# Patient Record
Sex: Male | Born: 1990 | Race: Black or African American | Hispanic: No | Marital: Single
Health system: Midwestern US, Community
[De-identification: ages and names within clinical notes are randomized; demographics above are authoritative.]

---

## 2013-03-08 ENCOUNTER — Encounter (HOSPITAL_COMMUNITY): Payer: Self-pay | Admitting: Emergency Medicine

## 2013-03-08 ENCOUNTER — Emergency Department (HOSPITAL_COMMUNITY): Payer: Self-pay

## 2013-03-08 ENCOUNTER — Emergency Department (HOSPITAL_COMMUNITY)
Admission: EM | Admit: 2013-03-08 | Discharge: 2013-03-08 | Disposition: A | Discharge status: Home / Self Care | Payer: Self-pay | Attending: Emergency Medicine | Admitting: Emergency Medicine

## 2013-03-08 DIAGNOSIS — R404 Transient alteration of awareness: Secondary | ICD-10-CM | POA: Insufficient documentation

## 2013-03-08 DIAGNOSIS — F411 Generalized anxiety disorder: Secondary | ICD-10-CM | POA: Insufficient documentation

## 2013-03-08 DIAGNOSIS — F17200 Nicotine dependence, unspecified, uncomplicated: Secondary | ICD-10-CM | POA: Insufficient documentation

## 2013-03-08 DIAGNOSIS — S0003XA Contusion of scalp, initial encounter: Secondary | ICD-10-CM | POA: Insufficient documentation

## 2013-03-08 DIAGNOSIS — S63509A Unspecified sprain of unspecified wrist, initial encounter: Secondary | ICD-10-CM | POA: Insufficient documentation

## 2013-03-08 DIAGNOSIS — S1093XA Contusion of unspecified part of neck, initial encounter: Secondary | ICD-10-CM | POA: Insufficient documentation

## 2013-03-08 DIAGNOSIS — S63501A Unspecified sprain of right wrist, initial encounter: Secondary | ICD-10-CM

## 2013-03-08 DIAGNOSIS — S0990XA Unspecified injury of head, initial encounter: Secondary | ICD-10-CM

## 2013-03-08 MED ORDER — OXYCODONE-ACETAMINOPHEN 5-325 MG PO TABS
1.0000 | ORAL_TABLET | Freq: Once | ORAL | Status: DC
  Filled 2013-03-08: qty 2

## 2013-03-08 MED ORDER — OXYCODONE-ACETAMINOPHEN 5-325 MG PO TABS
2.0000 | ORAL_TABLET | Freq: Once | ORAL | Status: AC
  Administered 2013-03-08: 2 via ORAL

## 2013-03-08 MED ORDER — NAPROXEN 500 MG PO TABS: 500.0000 mg | ORAL_TABLET | Freq: Two times a day (BID) | ORAL | Status: DC

## 2013-03-08 MED ORDER — OXYCODONE-ACETAMINOPHEN 5-325 MG PO TABS: 2.0000 | ORAL_TABLET | Freq: Four times a day (QID) | ORAL | Status: DC | PRN
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
## 2013-03-08 NOTE — ED Notes (Signed)
Pt back from x-ray.

## 2013-03-08 NOTE — ED Notes (Signed)
Patient transported to X-ray 
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
## 2013-08-11 ENCOUNTER — Encounter (HOSPITAL_COMMUNITY): Payer: Self-pay | Admitting: Emergency Medicine

## 2013-08-11 ENCOUNTER — Emergency Department (INDEPENDENT_AMBULATORY_CARE_PROVIDER_SITE_OTHER)
Admission: EM | Admit: 2013-08-11 | Discharge: 2013-08-11 | Disposition: A | Discharge status: Home / Self Care | Payer: BC Managed Care – PPO | Source: Home / Self Care | Attending: Family Medicine | Admitting: Family Medicine

## 2013-08-11 DIAGNOSIS — L989 Disorder of the skin and subcutaneous tissue, unspecified: Secondary | ICD-10-CM

## 2013-08-11 NOTE — ED Notes (Signed)
Pt  Has  Small  Swollen    Bump  on  Back of  Neck        Symptoms  x3  Days         Pt  Known causative   Agent           Pt  Is  In no   Acute  Distress       Sitting  Upright on  Exam table  In no acute  Distress

## 2013-08-11 NOTE — ED Provider Notes (Signed)
CSN: [id number]     Arrival date & time 08/11/13  1235 History   First MD Initiated Contact with Patient 08/11/13 1313     Chief Complaint  Patient presents with  . Mass   (Consider location/radiation/quality/duration/timing/severity/associated sxs/prior Treatment) HPI Comments: 54m presents c/o a possible mass or spider bite on the back of his neck. This started a few days ago. It was getting larger until 2 days ago started to get smaller. It is much smaller today than yesterday. Area is very slightly tender to touch. No fever, chills, or discharge from the lesion. His girlfriend says that some pus came out of it yesterday. His arms have also been feeling strange, he wonders if that could be affecting it but he believes he just slept wrong.   History reviewed. No pertinent past medical history. History reviewed. No pertinent past surgical history. History reviewed. No pertinent family history. History  Substance Use Topics  . Smoking status: Current Every Day Smoker  . Smokeless tobacco: Not on file  . Alcohol Use: Yes    Review of Systems  Constitutional: Negative for fever, chills and fatigue.  HENT: Negative for sore throat.   Eyes: Negative for visual disturbance.  Respiratory: Negative for cough and shortness of breath.   Cardiovascular: Negative for chest pain, palpitations and leg swelling.  Gastrointestinal: Negative for nausea, vomiting, abdominal pain, diarrhea and constipation.  Genitourinary: Negative for dysuria, urgency, frequency and hematuria.  Musculoskeletal: Negative for arthralgias, myalgias, neck pain and neck stiffness.  Skin:       See HPI  Neurological: Negative for dizziness, weakness and light-headedness.    Allergies  Sulfa antibiotics  Home Medications   Current Outpatient Rx  Name  Route  Sig  Dispense  Refill  . naproxen (NAPROSYN) 500 MG tablet   Oral   Take 1 tablet (500 mg total) by mouth 2 (two) times daily.   30 tablet   0   .  oxyCODONE-acetaminophen (PERCOCET/ROXICET) 5-325 MG per tablet   Oral   Take 2 tablets by mouth every 6 (six) hours as needed for pain.   20 tablet   0    BP 128/79  Pulse 68  Temp(Src) 98.7 F (37.1 C) (Oral)  Resp 14  SpO2 100% Physical Exam  Nursing note and vitals reviewed. Constitutional: He is oriented to person, place, and time. He appears well-developed and well-nourished. No distress.  HENT:  Head: Normocephalic and atraumatic.  Neck:    Pulmonary/Chest: Effort normal. No respiratory distress.  Neurological: He is alert and oriented to person, place, and time. Coordination normal.  Skin: Skin is warm and dry. No rash noted. He is not diaphoretic.  Psychiatric: He has a normal mood and affect. Judgment normal.    ED Course  Procedures (including critical care time) Labs Review Labs Reviewed - No data to display Imaging Review No results found.    MDM   1. Skin lesion    This is a pimple.  Self-limiting, f/u PRN     Graylon Good, PA-C 08/11/13 1328

## 2013-08-12 NOTE — ED Provider Notes (Signed)
Medical screening examination/treatment/procedure(s) were performed by a resident physician or non-physician practitioner and as the supervising physician I was immediately available for consultation/collaboration.  Keion Neels, MD    Diamonte Stavely S Shrihaan Porzio, MD 08/12/13 0737 

## 2013-08-23 ENCOUNTER — Encounter (HOSPITAL_COMMUNITY): Payer: Self-pay | Admitting: Emergency Medicine

## 2013-08-23 ENCOUNTER — Emergency Department (HOSPITAL_COMMUNITY)
Admission: EM | Admit: 2013-08-23 | Discharge: 2013-08-23 | Disposition: A | Discharge status: Home / Self Care | Payer: BC Managed Care – PPO | Attending: Emergency Medicine | Admitting: Emergency Medicine

## 2013-08-23 DIAGNOSIS — R5383 Other fatigue: Secondary | ICD-10-CM | POA: Insufficient documentation

## 2013-08-23 DIAGNOSIS — R5381 Other malaise: Secondary | ICD-10-CM | POA: Insufficient documentation

## 2013-08-23 DIAGNOSIS — K529 Noninfective gastroenteritis and colitis, unspecified: Secondary | ICD-10-CM

## 2013-08-23 DIAGNOSIS — Z791 Long term (current) use of non-steroidal anti-inflammatories (NSAID): Secondary | ICD-10-CM | POA: Insufficient documentation

## 2013-08-23 DIAGNOSIS — K5289 Other specified noninfective gastroenteritis and colitis: Secondary | ICD-10-CM | POA: Insufficient documentation

## 2013-08-23 DIAGNOSIS — F17200 Nicotine dependence, unspecified, uncomplicated: Secondary | ICD-10-CM | POA: Insufficient documentation

## 2013-08-23 LAB — CBC WITH DIFFERENTIAL/PLATELET
Eosinophils Absolute: 0.3 10*3/uL (ref 0.0–0.7)
Eosinophils Relative: 4 % (ref 0–5)
HCT: 41.6 % (ref 39.0–52.0)
Lymphs Abs: 2.5 10*3/uL (ref 0.7–4.0)
MCH: 30.6 pg (ref 26.0–34.0)
MCV: 86.7 fL (ref 78.0–100.0)
Monocytes Absolute: 0.6 10*3/uL (ref 0.1–1.0)
Platelets: 205 10*3/uL (ref 150–400)
RBC: 4.8 MIL/uL (ref 4.22–5.81)

## 2013-08-23 LAB — COMPREHENSIVE METABOLIC PANEL
ALT: 20 U/L (ref 0–53)
BUN: 12 mg/dL (ref 6–23)
CO2: 24 mEq/L (ref 19–32)
Calcium: 9.1 mg/dL (ref 8.4–10.5)
Creatinine, Ser: 0.96 mg/dL (ref 0.50–1.35)
GFR calc Af Amer: >90 mL/min (ref >90)
GFR calc non Af Amer: >90 mL/min (ref >90)
Glucose, Bld: 114 mg/dL — ABNORMAL HIGH (ref 70–99)
Sodium: 141 mEq/L (ref 135–145)
Total Protein: 6.9 g/dL (ref 6.0–8.3)

## 2013-08-23 LAB — URINALYSIS, ROUTINE W REFLEX MICROSCOPIC
Bilirubin Urine: NEGATIVE
Glucose, UA: NEGATIVE mg/dL
Hgb urine dipstick: NEGATIVE
Protein, ur: NEGATIVE mg/dL
Specific Gravity, Urine: 1.034 — ABNORMAL HIGH (ref 1.005–1.030)
Urobilinogen, UA: 1 mg/dL (ref 0.0–1.0)

## 2013-08-23 LAB — LIPASE, BLOOD: Lipase: 40 U/L (ref 11–59)

## 2013-08-23 MED ORDER — PROMETHAZINE HCL 25 MG PO TABS: 25.0000 mg | ORAL_TABLET | Freq: Four times a day (QID) | ORAL | Status: DC | PRN

## 2013-08-23 MED ORDER — ONDANSETRON HCL 4 MG/2ML IJ SOLN
4.0000 mg | Freq: Once | INTRAMUSCULAR | Status: AC
  Administered 2013-08-23: 4 mg via INTRAVENOUS
  Filled 2013-08-23: qty 2

## 2013-08-23 MED ORDER — KETOROLAC TROMETHAMINE 30 MG/ML IJ SOLN
30.0000 mg | Freq: Once | INTRAMUSCULAR | Status: AC
  Administered 2013-08-23: 30 mg via INTRAVENOUS
  Filled 2013-08-23: qty 1

## 2013-08-23 MED ORDER — SODIUM CHLORIDE 0.9 % IV SOLN
Freq: Once | INTRAVENOUS | Status: AC
  Administered 2013-08-23: 05:00:00 via INTRAVENOUS

## 2013-08-23 NOTE — ED Provider Notes (Signed)
CSN: [id number]     Arrival date & time 08/23/13  0416 History   First MD Initiated Contact with Patient 08/23/13 0424     Chief Complaint  Patient presents with  . Abdominal Pain  . Fatigue  . Nausea   (Consider location/radiation/quality/duration/timing/severity/associated sxs/prior Treatment) HPI Comments: Patient is an otherwise healthy 22 year old male presents to the emergency department with generalized abdominal cramping, nausea, vomiting, and diarrhea for the past 6 hours. He states that he ate a restaurant just before his symptoms began. Everything is nonbloody. He denies fevers. He has had no prior abdominal surgery.  Patient is a 22 y.o. male presenting with abdominal pain. The history is provided by the patient.  Abdominal Pain Pain location:  Generalized Pain quality: cramping   Pain radiates to:  Does not radiate Pain severity:  Moderate Onset quality:  Sudden Duration:  6 hours Timing:  Constant Progression:  Unchanged Chronicity:  New Context: eating   Relieved by:  Nothing Worsened by:  Nothing tried   History reviewed. No pertinent past medical history. History reviewed. No pertinent past surgical history. History reviewed. No pertinent family history. History  Substance Use Topics  . Smoking status: Current Every Day Smoker  . Smokeless tobacco: Not on file  . Alcohol Use: Yes    Review of Systems  Gastrointestinal: Positive for abdominal pain.  All other systems reviewed and are negative.    Allergies  Sulfa antibiotics  Home Medications   Current Outpatient Rx  Name  Route  Sig  Dispense  Refill  . naproxen (NAPROSYN) 500 MG tablet   Oral   Take 1 tablet (500 mg total) by mouth 2 (two) times daily.   30 tablet   0   . oxyCODONE-acetaminophen (PERCOCET/ROXICET) 5-325 MG per tablet   Oral   Take 2 tablets by mouth every 6 (six) hours as needed for pain.   20 tablet   0    BP 137/77  Pulse 92  Temp(Src) 98.3 F (36.8 C) (Oral)   Resp 18  Ht 5\' 11"  (1.803 m)  Wt 225 lb (102.059 kg)  BMI 31.39 kg/m2  SpO2 100% Physical Exam  Nursing note and vitals reviewed. Constitutional: He is oriented to person, place, and time. He appears well-developed and well-nourished. No distress.  HENT:  Head: Normocephalic and atraumatic.  Mouth/Throat: Oropharynx is clear and moist.  Neck: Normal range of motion. Neck supple.  Cardiovascular: Normal rate, regular rhythm and normal heart sounds.   No murmur heard. Pulmonary/Chest: Effort normal and breath sounds normal. No respiratory distress. He has no wheezes.  Abdominal: Soft. Bowel sounds are normal. He exhibits no distension. There is tenderness.  There is mild tenderness to palpation in all 4 quadrants with no rebound and no guarding.  Musculoskeletal: Normal range of motion. He exhibits no edema.  Neurological: He is alert and oriented to person, place, and time.  Skin: Skin is warm and dry. He is not diaphoretic.    ED Course  Procedures (including critical care time) Labs Review Labs Reviewed  URINALYSIS, ROUTINE W REFLEX MICROSCOPIC  CBC WITH DIFFERENTIAL  COMPREHENSIVE METABOLIC PANEL  LIPASE, BLOOD   Imaging Review No results found.    MDM  No diagnosis found. Patient presents here with sudden onset abdominal pain, nausea vomiting and diarrhea. Workup reveals normal laboratory studies. His exam was unremarkable. He was given fluids, Zofran, Toradol and is now feeling better. Physically he is stable for discharge to home. His presentation, exam, and workup was  all consistent with either a food borne illness or viral gastroenteritis.    Geoffery Lyons, MD 08/23/13 [phone]
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
## 2015-03-24 ENCOUNTER — Inpatient Hospital Stay
Admit: 2015-03-24 | Discharge: 2015-03-24 | Disposition: A | Discharge status: Home / Self Care | Payer: BLUE CROSS/BLUE SHIELD | Attending: Emergency Medicine

## 2015-03-24 DIAGNOSIS — S51011A Laceration without foreign body of right elbow, initial encounter: Secondary | ICD-10-CM

## 2015-03-24 MED ORDER — DIPHTH,PERTUS(AC)TETANUS VAC(PF) 2.5 LF UNIT-8 MCG-5 LF/0.5 ML INJ
INTRAMUSCULAR | Status: AC
Start: 2015-03-24 — End: 2015-03-24
  Administered 2015-03-24: 14:00:00 via INTRAMUSCULAR

## 2015-03-24 MED FILL — BOOSTRIX TDAP 2.5 LF UNIT-8 MCG-5 LF/0.5 ML INTRAMUSCULAR SUSPENSION: INTRAMUSCULAR | Qty: 1

## 2015-03-24 NOTE — ED Notes (Signed)
I have reviewed discharge instructions with the patient.  The patient verbalized understanding.

## 2015-03-24 NOTE — ED Notes (Signed)
Bandage applied to area

## 2015-03-24 NOTE — ED Notes (Signed)
Pt st he was working when he hit his right elbow on something and cut it. Pt st unknown last tetanus

## 2015-03-24 NOTE — ED Provider Notes (Signed)
HPI Comments: Patient is here with a laceration to his right elbow that happened prior to arrival as he was working in his yard.  He put his arm back and it hit a fence.  He is right-handed.  He is not having any other complaints.  He is unsure of his last tetanus.  He is ambulatory to the room without difficulty.    Patient is a 24 y.o. [name] presenting with skin laceration. The history is provided by the patient.   Laceration          History reviewed. No pertinent past medical history.    History reviewed. No pertinent past surgical history.      History reviewed. No pertinent family history.    History     Social History   ??? Marital Status: SINGLE     Spouse Name: N/A   ??? Number of Children: N/A   ??? Years of Education: N/A     Occupational History   ??? Not on file.     Social History Main Topics   ??? Smoking status: Never Smoker    ??? Smokeless tobacco: Not on file   ??? Alcohol Use: Not on file   ??? Drug Use: Not on file   ??? Sexual Activity: Not on file     Other Topics Concern   ??? Not on file     Social History Narrative   ??? No narrative on file         ALLERGIES: Sulfa (sulfonamide antibiotics)    Review of Systems   Constitutional: Negative.    HENT: Negative.    Eyes: Negative.    Respiratory: Negative.    Cardiovascular: Negative.    Gastrointestinal: Negative.    Genitourinary: Negative.    Musculoskeletal: Negative.    Skin: Positive for wound.   Neurological: Negative.    Psychiatric/Behavioral: Negative.    All other systems reviewed and are negative.      Filed Vitals:    03/24/15 0946   BP: 118/79   Pulse: 64   Temp: 98 ??F (36.7 ??C)   Resp: 18   Height:  (1.803 m)   Weight: 102.059 kg (225 lb)   SpO2: 96%            Physical Exam   Constitutional: He is oriented to person, place, and time. He appears well-developed and well-nourished.   HENT:   Head: Normocephalic and atraumatic.   Right Ear: External ear normal.   Left Ear: External ear normal.   Nose: Nose normal.    Mouth/Throat: Oropharynx is clear and moist.   Eyes: Conjunctivae and EOM are normal. Pupils are equal, round, and reactive to light.   Neck: Normal range of motion. Neck supple.   Cardiovascular: Normal rate, regular rhythm, normal heart sounds and intact distal pulses.    Pulmonary/Chest: Effort normal and breath sounds normal.   Abdominal: Soft. Bowel sounds are normal.   Musculoskeletal: Normal range of motion.        Arms:  Neurological: He is alert and oriented to person, place, and time. He has normal reflexes.   Skin: Skin is warm and dry.   Psychiatric: He has a normal mood and affect. His behavior is normal. Judgment and thought content normal.   Nursing note and vitals reviewed.       MDM    Wound Repair  Date/Time: 03/24/2015 10:21 AM  Performed by: PAPreparation: skin prepped with Betadine  Pre-procedure re-eval: Immediately prior to the  procedure, the patient was reevaluated and found suitable for the planned procedure and any planned medications.  Time out: Immediately prior to the procedure a time out was called to verify the correct patient, procedure, equipment, staff and marking as appropriate..  Location details: right elbow  Wound length:2.5 cm or less  Anesthesia: local infiltration  Local anesthetic: lidocaine 1% without epinephrine  Anesthetic total: 3 ml  Foreign bodies: no foreign bodies  Irrigation solution: saline  Irrigation method: syringe  Skin closure: Prolene  Number of sutures: 2  Technique: simple and interrupted  Dressing: antibiotic ointment and 4x4  Patient tolerance: Patient tolerated the procedure well with no immediate complications  My total time at bedside, performing this procedure was 1-Shane minutes.      The patient was observed in the ED.    Patient wash the area twice daily with soap and water, blot dry and apply Neosporin and a dressing.  He should return if there's any redness, swelling, exudate or other signs of infection.  We have updated his  tetanus today and will have him return in 2 weeks for suture removal since it is on the elbow.  I discussed the results of all labs, procedures, radiographs, and treatments with the patient and available family.  Treatment plan is agreed upon and the patient is ready for discharge.  All voiced understanding of the discharge plan and medication instructions or changes as appropriate.  Questions about treatment in the ED were answered.  All were encouraged to return should symptoms worsen or new problems develop.

## 2018-10-27 ENCOUNTER — Other Ambulatory Visit: Payer: Self-pay

## 2018-10-27 ENCOUNTER — Encounter: Payer: Self-pay | Admitting: Emergency Medicine

## 2018-10-27 ENCOUNTER — Emergency Department
Admission: EM | Admit: 2018-10-27 | Discharge: 2018-10-27 | Disposition: A | Discharge status: Home / Self Care | Payer: Self-pay | Attending: Emergency Medicine | Admitting: Emergency Medicine

## 2018-10-27 DIAGNOSIS — L03012 Cellulitis of left finger: Secondary | ICD-10-CM | POA: Insufficient documentation

## 2018-10-27 DIAGNOSIS — Z79899 Other long term (current) drug therapy: Secondary | ICD-10-CM | POA: Insufficient documentation

## 2018-10-27 DIAGNOSIS — F17200 Nicotine dependence, unspecified, uncomplicated: Secondary | ICD-10-CM | POA: Insufficient documentation

## 2018-10-27 MED ORDER — CLINDAMYCIN HCL 300 MG PO CAPS: 300.0000 mg | ORAL_CAPSULE | Freq: Three times a day (TID) | ORAL | 0 refills | Status: DC

## 2018-10-27 MED ORDER — CLINDAMYCIN HCL 150 MG PO CAPS
300.0000 mg | ORAL_CAPSULE | Freq: Once | ORAL | Status: AC
  Administered 2018-10-27: 300 mg via ORAL
  Filled 2018-10-27: qty 2

## 2018-10-27 NOTE — ED Provider Notes (Signed)
[facility] Emergency Department Provider Note  ____________________________________________  Time seen: Approximately 9:38 PM  I have reviewed the triage vital signs and the nursing notes.   HISTORY  Chief Complaint Foreign Body    HPI Omar Zimmerman is a 28 y.o. male presents to the emergency department with soft tissue swelling along the distal aspect of the left third digit with surrounding cellulitis.  Patient reports that he removed a splinter that was approximately 2 cm long that had become embedded in the proximal nail fold.  Patient noticed discomfort and swelling where foreign body was became concerned.  Patient reports that he smoked marijuana tonight and then took a nap.  Patient is concerned that "something is wrong".  He denies fever and chills.  He has been actively moving the digit.  His tetanus status is up-to-date.  No alleviating measures have been attempted.   History reviewed. No pertinent past medical history.  There are no active problems to display for this patient.   History reviewed. No pertinent surgical history.  Prior to Admission medications   Medication Sig Start Date End Date Taking? Authorizing Provider  clindamycin (CLEOCIN) 300 MG capsule Take 1 capsule (300 mg total) by mouth 3 (three) times daily for 7 days. 10/27/18 11/03/18  Orvil Feil, PA-C  naproxen (NAPROSYN) 500 MG tablet Take 1 tablet (500 mg total) by mouth 2 (two) times daily. 03/08/13   Marisa Severin, MD  oxyCODONE-acetaminophen (PERCOCET/ROXICET) 5-325 MG per tablet Take 2 tablets by mouth every 6 (six) hours as needed for pain. 03/08/13   Marisa Severin, MD  promethazine (PHENERGAN) 25 MG tablet Take 1 tablet (25 mg total) by mouth every 6 (six) hours as needed for nausea. 08/23/13   Geoffery Lyons, MD    Allergies Sulfa antibiotics  No family history on file.  Social History Social History   Tobacco Use  . Smoking status: Current Every Day Smoker  . Smokeless  tobacco: Never Used  Substance Use Topics  . Alcohol use: Yes  . Drug use: No     Review of Systems  Constitutional: No fever/chills Eyes: No visual changes. No discharge ENT: No upper respiratory complaints. Cardiovascular: no chest pain. Respiratory: no cough. No SOB. Gastrointestinal: No abdominal pain.  No nausea, no vomiting.  No diarrhea.  No constipation. Musculoskeletal: Negative for musculoskeletal pain. Skin: Patient has cellulitis at distal aspect of left third digit.  Neurological: Negative for headaches, focal weakness or numbness.   ____________________________________________   PHYSICAL EXAM:  VITAL SIGNS: ED Triage Vitals  Enc Vitals Group     BP 10/27/18 2109 (!) 142/92     Pulse Rate 10/27/18 2109 81     Resp 10/27/18 2109 16     Temp 10/27/18 2109 98.3 F (36.8 C)     Temp Source 10/27/18 2109 Oral     SpO2 10/27/18 2109 100 %     Weight 10/27/18 2106 220 lb (99.8 kg)     Height 10/27/18 2106 5\' 11"  (1.803 m)     Head Circumference --      Peak Flow --      Pain Score 10/27/18 2106 10     Pain Loc --      Pain Edu? --      Excl. in GC? --      Constitutional: Alert and oriented. Well appearing and in no acute distress. Eyes: Conjunctivae are normal. PERRL. EOMI. Head: Atraumatic. Cardiovascular: Normal rate, regular rhythm. Normal S1 and S2.  Good  peripheral circulation. Respiratory: Normal respiratory effort without tachypnea or retractions. Lungs CTAB. Good air entry to the bases with no decreased or absent breath sounds. Musculoskeletal: Patient has no fusiform swelling of left third digit.  No significant pain with palpation over the course of the flexor tendon of left third digit.  Mild discomfort with passive extension of left third digit.  Palpable radial pulse, left. Neurologic:  Normal speech and language. No gross focal neurologic deficits are appreciated.  Skin: Patient has soft tissue swelling along the proximal nail fold with  surrounding cellulitis. Psychiatric: Mood and affect are normal. Speech and behavior are normal. Patient exhibits appropriate insight and judgement.   ____________________________________________   LABS (all labs ordered are listed, but only abnormal results are displayed)  Labs Reviewed - No data to display ____________________________________________  EKG   ____________________________________________  RADIOLOGY  No results found.  ____________________________________________    PROCEDURES  Procedure(s) performed:    Procedures    Medications  clindamycin (CLEOCIN) capsule 300 mg (has no administration in time range)     ____________________________________________   INITIAL IMPRESSION / ASSESSMENT AND PLAN / ED COURSE  Pertinent labs & imaging results that were available during my care of the patient were reviewed by me and considered in my medical decision making (see chart for details).  Review of the [place] CSRS was performed in accordance of the [facility] prior to dispensing any controlled drugs.    Assessment and plan Finger cellulitis Patient presents to the emergency department with left middle finger swelling with mild surrounding cellulitis.  Differential diagnosis originally included flexor tenosynovitis, felon and finger cellulitis.  On physical exam, patient had no fusiform swelling or pain with palpation over the flexor tendon, decreasing suspicion for flexor tenosynovitis.  There was no pain or surrounding cellulitis along the pad of the finger, decreasing suspicion for felon.  Patient only had cellulitis and soft tissue swelling in vicinity of removed foreign body.  Patient declined x-ray examination in the emergency department.  Patient was started on clindamycin as patient cannot tolerate Bactrim due to sulfa allergy.  He was advised to follow-up with primary care as needed.  Strict return precautions were given to return to the emergency department for  new or worsening symptoms.  All patient questions were answered.    ____________________________________________  FINAL CLINICAL IMPRESSION(S) / ED DIAGNOSES  Final diagnoses:  Cellulitis of finger of left hand      NEW MEDICATIONS STARTED DURING THIS VISIT:  ED Discharge Orders         Ordered    clindamycin (CLEOCIN) 300 MG capsule  3 times daily     10/27/18 2131              This chart was dictated using voice recognition software/Dragon. Despite best efforts to proofread, errors can occur which can change the meaning. Any change was purely unintentional.    Orvil Feil, PA-C 10/27/18 2143    Emily Filbert, MD 10/27/18 [id number]

## 2018-10-27 NOTE — ED Notes (Signed)
Pt states that he pulled a splinter from his left middle finger nail today and then started having some pain with it. Pt states that he just wanted to have it checked out.

## 2018-10-27 NOTE — ED Triage Notes (Signed)
Patient ambulatory to triage with steady gait, without difficulty or distress noted; pt reports splinter to left middle fingernail since Friday, was able to remove today but cont to have pain

## 2018-10-30 ENCOUNTER — Emergency Department
Admission: EM | Admit: 2018-10-30 | Discharge: 2018-10-30 | Disposition: A | Discharge status: Home / Self Care | Payer: Self-pay | Attending: Emergency Medicine | Admitting: Emergency Medicine

## 2018-10-30 ENCOUNTER — Emergency Department: Payer: Self-pay

## 2018-10-30 DIAGNOSIS — L03011 Cellulitis of right finger: Secondary | ICD-10-CM

## 2018-10-30 DIAGNOSIS — F17210 Nicotine dependence, cigarettes, uncomplicated: Secondary | ICD-10-CM | POA: Insufficient documentation

## 2018-10-30 MED ORDER — CLINDAMYCIN HCL 150 MG PO CAPS
600.0000 mg | ORAL_CAPSULE | Freq: Once | ORAL | Status: AC
  Administered 2018-10-30: 600 mg via ORAL
  Filled 2018-10-30: qty 4

## 2018-10-30 MED ORDER — OXYCODONE-ACETAMINOPHEN 5-325 MG PO TABS: 1.0000 | ORAL_TABLET | Freq: Three times a day (TID) | ORAL | 0 refills | Status: AC | PRN

## 2018-10-30 MED ORDER — CLINDAMYCIN HCL 300 MG PO CAPS
300.0000 mg | ORAL_CAPSULE | Freq: Three times a day (TID) | ORAL | 0 refills | Status: AC
Start: 2018-10-30 — End: 2018-11-06

## 2018-10-30 MED ORDER — OXYCODONE-ACETAMINOPHEN 5-325 MG PO TABS
1.0000 | ORAL_TABLET | Freq: Once | ORAL | Status: AC
  Administered 2018-10-30: 1 via ORAL
  Filled 2018-10-30: qty 1

## 2018-10-30 NOTE — ED Triage Notes (Addendum)
Patient c/o pain, redness, and swelling of left middle finger X 1 week. Patient reports pain/swelling began after he got a splinter to base of fingernail at work.   Patient seen earlier this week in this ED, dx with cellulitis, and prescribed antibiotics.  Patient did not fill his antibiotics; Patient reports he was too busy.

## 2018-10-30 NOTE — ED Provider Notes (Signed)
[facility] Emergency Department Provider Note   First MD Initiated Contact with Patient 10/30/18 0301     (approximate)  I have reviewed the triage vital signs and the nursing notes.   HISTORY  Chief Complaint Hand Pain   HPI Omar Zimmerman is a 28 y.o. male returns to the emergency department after initial evaluation on 10/27/2018 secondary to right middle finger cellulitis.  Patient now returns with worsening swelling redness and discomfort of the right middle finger.  Patient states that he was unable to fill his prescription for antibiotics which she was given.  Patient denies any fever.  Patient stated that symptoms began after a splinter was embedded in the proximal nail fold.  Past medical history Right middle finger cellulitis diagnosed 10/27/2018 There are no active problems to display for this patient.   History reviewed. No pertinent surgical history.  Prior to Admission medications   Medication Sig Start Date End Date Taking? Authorizing Provider  clindamycin (CLEOCIN) 300 MG capsule Take 1 capsule (300 mg total) by mouth 3 (three) times daily for 7 days. 10/30/18 11/06/18  Darci Current, MD  naproxen (NAPROSYN) 500 MG tablet Take 1 tablet (500 mg total) by mouth 2 (two) times daily. 03/08/13   Marisa Severin, MD  oxyCODONE-acetaminophen (PERCOCET) 5-325 MG tablet Take 1 tablet by mouth every 8 (eight) hours as needed for up to 10 doses. 10/30/18   Darci Current, MD  oxyCODONE-acetaminophen (PERCOCET/ROXICET) 5-325 MG per tablet Take 2 tablets by mouth every 6 (six) hours as needed for pain. 03/08/13   Marisa Severin, MD  promethazine (PHENERGAN) 25 MG tablet Take 1 tablet (25 mg total) by mouth every 6 (six) hours as needed for nausea. 08/23/13   Geoffery Lyons, MD    Allergies Sulfa antibiotics  No family history on file.  Social History Social History   Tobacco Use  . Smoking status: Current Every Day Smoker  . Smokeless tobacco: Never Used    Substance Use Topics  . Alcohol use: Yes  . Drug use: No    Review of Systems Constitutional: No fever/chills Eyes: No visual changes. ENT: No sore throat. Cardiovascular: Denies chest pain. Respiratory: Denies shortness of breath. Gastrointestinal: No abdominal pain.  No nausea, no vomiting.  No diarrhea.  No constipation. Genitourinary: Negative for dysuria. Musculoskeletal: Negative for neck pain.  Negative for back pain.  Right middle finger pain swelling and redness Integumentary: Negative for rash. Neurological: Negative for headaches, focal weakness or numbness.  ____________________________________________   PHYSICAL EXAM:  VITAL SIGNS: ED Triage Vitals  Enc Vitals Group     BP 10/30/18 0131 133/76     Pulse Rate 10/30/18 0131 87     Resp 10/30/18 0131 17     Temp 10/30/18 0131 98.8 F (37.1 C)     Temp Source 10/30/18 0131 Oral     SpO2 10/30/18 0131 98 %     Weight 10/30/18 0132 100 kg (220 lb 7.4 oz)     Height --      Head Circumference --      Peak Flow --      Pain Score 10/30/18 0134 10     Pain Loc --      Pain Edu? --      Excl. in GC? --     Constitutional: Alert and oriented. Well appearing and in no acute distress. Eyes: Conjunctivae are normal.  Mouth/Throat: Mucous membranes are moist.  Oropharynx non-erythematous. Neck: No stridor. Cardiovascular: Normal  rate, regular rhythm. Good peripheral circulation. Grossly normal heart sounds. Respiratory: Normal respiratory effort.  No retractions. Lungs CTAB. Gastrointestinal: Soft and nontender. No distention.  Musculoskeletal: Positive for right middle finger pain redness swelling consistent with cellulitis.  No evidence of flexor tenosynovitis or deep soft tissue hand infection. Neurologic:  Normal speech and language. No gross focal neurologic deficits are appreciated.  Skin:  Skin is warm, dry and intact. No rash noted. Psychiatric: Mood and affect are normal. Speech and behavior are  normal.  ____________________________________________ ____________________  RADIOLOGY I, Darci CurrentANDOLPH N Kadarious Dikes, personally viewed and evaluated these images (plain radiographs) as part of my medical decision making, as well as reviewing the written report by the radiologist.  ED MD interpretation: Negative right middle finger x-ray per radiologist.  Official radiology report(s): Dg Finger Middle Left  Result Date: 10/30/2018 CLINICAL DATA:  Left middle finger swelling. EXAM: LEFT MIDDLE FINGER 2+V COMPARISON:  None. FINDINGS: There is no evidence of fracture or dislocation. There is no evidence of arthropathy or other focal bone abnormality. Soft tissues are unremarkable. IMPRESSION: Negative. Electronically Signed   By: Deatra RobinsonKevin  Herman M.D.   On: 10/30/2018 02:19    ___ Procedures   ____________________________________________   INITIAL IMPRESSION / ASSESSMENT AND PLAN / ED COURSE  As part of my medical decision making, I reviewed the following data within the electronic MEDICAL RECORD NUMBER 28 year old male returning to the emergency department with right middle finger cellulitis.  No clinical evidence at this time for flexor tenosynovitis however apparent right middle finger cellulitis that does not extend to the palm.  Patient given oral clindamycin in the emergency department spoke with the patient at length regarding the necessity of filling his prescription and taking as prescribed.  I informed the patient at length regarding the potential risk of not doing so.  I also informed the patient of warning signs that would warrant immediate return to the emergency department. ____________________________________________  FINAL CLINICAL IMPRESSION(S) / ED DIAGNOSES  Final diagnoses:  Cellulitis of middle finger, right     MEDICATIONS GIVEN DURING THIS VISIT:  Medications  oxyCODONE-acetaminophen (PERCOCET/ROXICET) 5-325 MG per tablet 1 tablet (1 tablet Oral Given 10/30/18 0319)   clindamycin (CLEOCIN) capsule 600 mg (600 mg Oral Given 10/30/18 0319)     ED Discharge Orders         Ordered    clindamycin (CLEOCIN) 300 MG capsule  3 times daily     10/30/18 0348    oxyCODONE-acetaminophen (PERCOCET) 5-325 MG tablet  Every 8 hours PRN     10/30/18 0350           Note:  This document was prepared using Dragon voice recognition software and may include unintentional dictation errors.    Darci CurrentBrown, Paia N, MD 10/30/18 2255

## 2018-11-02 ENCOUNTER — Emergency Department
Admission: EM | Admit: 2018-11-02 | Discharge: 2018-11-02 | Disposition: A | Discharge status: Home / Self Care | Payer: Self-pay | Attending: Student in an Organized Health Care Education/Training Program | Admitting: Student in an Organized Health Care Education/Training Program

## 2018-11-02 ENCOUNTER — Other Ambulatory Visit: Payer: Self-pay

## 2018-11-02 DIAGNOSIS — F17200 Nicotine dependence, unspecified, uncomplicated: Secondary | ICD-10-CM | POA: Insufficient documentation

## 2018-11-02 DIAGNOSIS — L089 Local infection of the skin and subcutaneous tissue, unspecified: Secondary | ICD-10-CM

## 2018-11-02 DIAGNOSIS — L03012 Cellulitis of left finger: Secondary | ICD-10-CM | POA: Insufficient documentation

## 2018-11-02 MED ORDER — SODIUM CHLORIDE 0.9 % IV SOLN
1.0000 g | Freq: Once | INTRAVENOUS | Status: AC
  Administered 2018-11-02: 1 g via INTRAVENOUS
  Filled 2018-11-02: qty 10

## 2018-11-02 MED ORDER — VANCOMYCIN HCL IN DEXTROSE 1-5 GM/200ML-% IV SOLN
1000.0000 mg | Freq: Once | INTRAVENOUS | Status: AC
  Administered 2018-11-02: 1000 mg via INTRAVENOUS
  Filled 2018-11-02: qty 200

## 2018-11-02 MED ORDER — MORPHINE SULFATE (PF) 4 MG/ML IV SOLN
4.0000 mg | Freq: Once | INTRAVENOUS | Status: AC
  Administered 2018-11-02: 4 mg via INTRAVENOUS
  Filled 2018-11-02: qty 1

## 2018-11-02 MED ORDER — ONDANSETRON HCL 4 MG/2ML IJ SOLN
4.0000 mg | Freq: Once | INTRAMUSCULAR | Status: AC
  Administered 2018-11-02: 4 mg via INTRAVENOUS
  Filled 2018-11-02: qty 2

## 2018-11-02 NOTE — ED Triage Notes (Signed)
Pt c/o continued swelling and redness to the left middle finger, states he has been taking his abx that were rx since visit on 1/25, was seen initially here on 1/22 but was not taking his medication.

## 2018-11-02 NOTE — ED Notes (Signed)
See triage note  Presents with cont's swelling and redness to left middle finger

## 2018-11-02 NOTE — Discharge Instructions (Addendum)
Return to the emergency department tomorrow after 3 PM for repeat evaluation.  Soak the finger in warm salt water for 10 to 15 minutes 3 times a day.  Continue to take your clindamycin.

## 2018-11-02 NOTE — ED Provider Notes (Signed)
[facility] Emergency Department Provider Note  ____________________________________________   First MD Initiated Contact with Patient 11/02/18 1656     (approximate)  I have reviewed the triage vital signs and the nursing notes.   HISTORY  Chief Complaint Wound Infection    HPI Omar Zimmerman is a 28 y.o. male presents emergency department complaining of continued redness and swelling to the left middle finger.  He states he had a splinter in that and he removed the splinter and then developed an infection.  He was given an antibiotic which she has been taking religiously.  He states that now there is pus in pain.  He states he is able to bend it better than he was the other day.  He denies any fever or chills.  Denies any drainage from the area.    History reviewed. No pertinent past medical history.  There are no active problems to display for this patient.   History reviewed. No pertinent surgical history.  Prior to Admission medications   Medication Sig Start Date End Date Taking? Authorizing Provider  clindamycin (CLEOCIN) 300 MG capsule Take 1 capsule (300 mg total) by mouth 3 (three) times daily for 7 days. 10/30/18 11/06/18  Darci CurrentBrown, [place] N, MD  oxyCODONE-acetaminophen (PERCOCET) 5-325 MG tablet Take 1 tablet by mouth every 8 (eight) hours as needed for up to 10 doses. 10/30/18   Darci CurrentBrown, [place] N, MD    Allergies Sulfa antibiotics  No family history on file.  Social History Social History   Tobacco Use  . Smoking status: Current Every Day Smoker  . Smokeless tobacco: Never Used  Substance Use Topics  . Alcohol use: Yes  . Drug use: No    Review of Systems  Constitutional: No fever/chills Eyes: No visual changes. ENT: No sore throat. Respiratory: Denies cough Genitourinary: Negative for dysuria. Musculoskeletal: Negative for back pain.  Positive for left middle finger pain Skin: Negative for  rash.    ____________________________________________   PHYSICAL EXAM:  VITAL SIGNS: ED Triage Vitals  Enc Vitals Group     BP 11/02/18 1641 130/74     Pulse Rate 11/02/18 1641 77     Resp 11/02/18 1641 17     Temp 11/02/18 1641 (!) 97.5 F (36.4 C)     Temp Source 11/02/18 1641 Oral     SpO2 11/02/18 1641 98 %     Weight 11/02/18 1643 220 lb 7.4 oz (100 kg)     Height 11/02/18 1643 5\' 11"  (1.803 m)     Head Circumference --      Peak Flow --      Pain Score 11/02/18 1643 10     Pain Loc --      Pain Edu? --      Excl. in GC? --     Constitutional: Alert and oriented. Well appearing and in no acute distress. Eyes: Conjunctivae are normal.  Head: Atraumatic. Nose: No congestion/rhinnorhea. Mouth/Throat: Mucous membranes are moist.   Neck:  supple no lymphadenopathy noted Cardiovascular: Normal rate, regular rhythm. Respiratory: Normal respiratory effort.  No retractions GU: deferred Musculoskeletal: FROM all extremities, warm and well perfused, left middle finger is very red and swollen with a pus pocket noted at the cuticle typical paronychia.  However the redness extends to the proximal phalanx and is very soft and tender at the area adjacent to the paronychia. Neurologic:  Normal speech and language.  Skin:  Skin is warm, dry and intact. No rash noted. Psychiatric:  Mood and affect are normal. Speech and behavior are normal.  ____________________________________________   LABS (all labs ordered are listed, but only abnormal results are displayed)  Labs Reviewed - No data to display ____________________________________________   ____________________________________________  RADIOLOGY    ____________________________________________   PROCEDURES  Procedure(s) performed: The paronychia was drained with an 18-gauge needle.  Patient tolerated procedure well. Rocephin 1 g IV, vancomycin 1 g  IV  Procedures    ____________________________________________   INITIAL IMPRESSION / ASSESSMENT AND PLAN / ED COURSE  Pertinent labs & imaging results that were available during my care of the patient were reviewed by me and considered in my medical decision making (see chart for details).   Patient is 28 year old male presents emergency department complaining of continued infection and pain to the left middle finger.  He has been taking an antibiotic that he was prescribed on 1/25  Physical exam shows a pus pocket noted near the cuticle with redness extending to the proximal aspect of the left middle finger.  The area is tender to palpation.  The pus pocket was drained with an 18-gauge needle  Patient was given Rocephin 1 g IV and vancomycin 1 g IV.  He is to continue his clindamycin tomorrow.  Return after 3:00 for reevaluation to ensure that the area has improved.  Explained to him that if the area is not improving he will need to be admitted for overnight IV antibiotics.  He states he understands and will comply.     As part of my medical decision making, I reviewed the following data within the electronic MEDICAL RECORD NUMBER Nursing notes reviewed and incorporated, Old chart reviewed, Notes from prior ED visits and [place] Controlled Substance Database  ____________________________________________   FINAL CLINICAL IMPRESSION(S) / ED DIAGNOSES  Final diagnoses:  Finger infection      NEW MEDICATIONS STARTED DURING THIS VISIT:  Discharge Medication List as of 11/02/2018  6:47 PM       Note:  This document was prepared using Dragon voice recognition software and may include unintentional dictation errors.    Faythe GheeFisher, Casimir Barcellos W, PA-C 11/02/18 2044    Willy Eddyobinson, Patrick, MD 11/02/18 2212

## 2018-11-02 NOTE — ED Triage Notes (Signed)
Pt reports swelling and infection to left middle finger.

## 2018-11-03 ENCOUNTER — Emergency Department
Admission: EM | Admit: 2018-11-03 | Discharge: 2018-11-03 | Disposition: A | Discharge status: Home / Self Care | Payer: Self-pay | Attending: Emergency Medicine | Admitting: Emergency Medicine

## 2018-11-03 ENCOUNTER — Encounter: Payer: Self-pay | Admitting: Emergency Medicine

## 2018-11-03 ENCOUNTER — Other Ambulatory Visit: Payer: Self-pay

## 2018-11-03 DIAGNOSIS — Z5189 Encounter for other specified aftercare: Secondary | ICD-10-CM | POA: Insufficient documentation

## 2018-11-03 DIAGNOSIS — F17210 Nicotine dependence, cigarettes, uncomplicated: Secondary | ICD-10-CM | POA: Insufficient documentation

## 2018-11-03 DIAGNOSIS — L02512 Cutaneous abscess of left hand: Secondary | ICD-10-CM | POA: Insufficient documentation

## 2018-11-03 NOTE — ED Provider Notes (Signed)
[facility] Emergency Department Provider Note  ____________________________________________   First MD Initiated Contact with Patient 11/03/18 1727     (approximate)  I have reviewed the triage vital signs and the nursing notes.   HISTORY  Chief Complaint Wound Check    HPI Omar Zimmerman is a 28 y.o. male presents emergency department for wound recheck.  He had a finger abscess which was drained yesterday.  He was also given vancomycin and Rocephin IV.  He states that the finger is not as painful as it was yesterday and appears to be better.  Is able to bend it more freely.  He denies fever or chills.    History reviewed. No pertinent past medical history.  There are no active problems to display for this patient.   History reviewed. No pertinent surgical history.  Prior to Admission medications   Medication Sig Start Date End Date Taking? Authorizing Provider  clindamycin (CLEOCIN) 300 MG capsule Take 1 capsule (300 mg total) by mouth 3 (three) times daily for 7 days. 10/30/18 11/06/18  Darci Current, MD  oxyCODONE-acetaminophen (PERCOCET) 5-325 MG tablet Take 1 tablet by mouth every 8 (eight) hours as needed for up to 10 doses. 10/30/18   Darci Current, MD    Allergies Sulfa antibiotics  No family history on file.  Social History Social History   Tobacco Use  . Smoking status: Current Every Day Smoker    Packs/day: 0.25    Types: Cigarettes  . Smokeless tobacco: Never Used  Substance Use Topics  . Alcohol use: Yes  . Drug use: No    Review of Systems  Constitutional: No fever/chills Eyes: No visual changes. ENT: No sore throat. Respiratory: Denies cough Genitourinary: Negative for dysuria. Musculoskeletal: Negative for back pain.  Recheck for a finger infection Skin: Negative for rash.    ____________________________________________   PHYSICAL EXAM:  VITAL SIGNS: ED Triage Vitals  Enc Vitals Group     BP 11/03/18  1646 (!) 143/77     Pulse Rate 11/03/18 1646 70     Resp 11/03/18 1646 20     Temp 11/03/18 1646 98.6 F (37 C)     Temp Source 11/03/18 1646 Oral     SpO2 11/03/18 1646 99 %     Weight 11/03/18 1647 220 lb (99.8 kg)     Height 11/03/18 1647 5\' 11"  (1.803 m)     Head Circumference --      Peak Flow --      Pain Score 11/03/18 1709 6     Pain Loc --      Pain Edu? --      Excl. in GC? --     Constitutional: Alert and oriented. Well appearing and in no acute distress. Eyes: Conjunctivae are normal.  Head: Atraumatic. Nose: No congestion/rhinnorhea. Mouth/Throat: Mucous membranes are moist.   Neck:  supple no lymphadenopathy noted Cardiovascular: Normal rate, regular rhythm. Respiratory: Normal respiratory effort.  No retractions, GU: deferred Musculoskeletal: FROM all extremities, warm and well perfused, the left middle finger still has some fluctuance and redness distally near the nailbed.  No pus pocket is noted at this time.  Neurovascular is intact.  Appears to be much improved from yesterday.  Range of motion is much easier and less painful for the patient. Neurologic:  Normal speech and language.  Skin:  Skin is warm, dry and intact. No rash noted. Psychiatric: Mood and affect are normal. Speech and behavior are normal.  ____________________________________________  LABS (all labs ordered are listed, but only abnormal results are displayed)  Labs Reviewed - No data to display ____________________________________________   ____________________________________________  RADIOLOGY    ____________________________________________   PROCEDURES  Procedure(s) performed: No  Procedures    ____________________________________________   INITIAL IMPRESSION / ASSESSMENT AND PLAN / ED COURSE  Pertinent labs & imaging results that were available during my care of the patient were reviewed by me and considered in my medical decision making (see chart for details).     Patient is here for a wound recheck.  Physical exam shows improved cellulitis and abscess of the left middle finger.  Explained to the patient that we will continue the course of antibiotics.  He is to return to the emergency department if worsening.  Recheck on Saturday if the fluctuant area has not resolved.  He is to continue using warm water soaks with Epson salt.  States he understands will comply.  Is discharged in stable condition.     As part of my medical decision making, I reviewed the following data within the electronic MEDICAL RECORD NUMBER Nursing notes reviewed and incorporated, Old chart reviewed, Notes from prior ED visits and [place] Controlled Substance Database  ____________________________________________   FINAL CLINICAL IMPRESSION(S) / ED DIAGNOSES  Final diagnoses:  Visit for wound check      NEW MEDICATIONS STARTED DURING THIS VISIT:  New Prescriptions   No medications on file     Note:  This document was prepared using Dragon voice recognition software and may include unintentional dictation errors.    Faythe Ghee, PA-C 11/03/18 1759    Schaevitz, Myra Rude, MD 11/03/18 [phone]

## 2018-11-03 NOTE — ED Triage Notes (Signed)
Pt in via POV, seen yesterday w/ abscess drained and advised to return today for recheck.  Vitals WDL, NAD noted at this time.

## 2018-11-03 NOTE — Discharge Instructions (Addendum)
Return to the emergency department if the one area has not cleared up by Saturday.  Continue to soak the finger in warm water with salt.  Finish the clindamycin.  Return earlier if it is worsening.

## 2018-11-03 NOTE — ED Notes (Signed)
Reference triage note. Pt in NAD at this time.  

## 2018-11-07 ENCOUNTER — Emergency Department
Admission: EM | Admit: 2018-11-07 | Discharge: 2018-11-07 | Disposition: A | Discharge status: Home / Self Care | Payer: Self-pay | Attending: Emergency Medicine | Admitting: Emergency Medicine

## 2018-11-07 ENCOUNTER — Encounter: Payer: Self-pay | Admitting: Emergency Medicine

## 2018-11-07 DIAGNOSIS — F17210 Nicotine dependence, cigarettes, uncomplicated: Secondary | ICD-10-CM | POA: Insufficient documentation

## 2018-11-07 DIAGNOSIS — L03011 Cellulitis of right finger: Secondary | ICD-10-CM | POA: Insufficient documentation

## 2018-11-07 NOTE — ED Triage Notes (Signed)
Patient presents to the ED with the skin peeling from his middle finger on his left hand.  Patient states, "It had an infection in it and they poked a hole in it and now the skin is coming off and I didn't know if I should put cream on it or what."  Patient's finger looks to be healing well, no obvious signs of infection at this time.

## 2018-11-07 NOTE — Discharge Instructions (Addendum)
The dead skin on the finger will continue to peel.  Apply Vaseline to the area.  Trim the dead skin back with a pair of scissors.  If the area becomes more infected please return to the emergency department.

## 2018-11-07 NOTE — ED Notes (Signed)
AAOx3.  Skin warm and dry.  NAD 

## 2018-11-07 NOTE — ED Provider Notes (Signed)
[facility] Emergency Department Provider Note  ____________________________________________   First MD Initiated Contact with Patient 11/07/18 1508     (approximate)  I have reviewed the triage vital signs and the nursing notes.   HISTORY  Chief Complaint Finger Injury    HPI Omar Zimmerman is a 28 y.o. male presents emergency department for recheck of a finger infection.  States the skin has started to peel away at the area that was infected.  He just wants it to be rechecked.    History reviewed. No pertinent past medical history.  There are no active problems to display for this patient.   History reviewed. No pertinent surgical history.  Prior to Admission medications   Medication Sig Start Date End Date Taking? Authorizing Provider  oxyCODONE-acetaminophen (PERCOCET) 5-325 MG tablet Take 1 tablet by mouth every 8 (eight) hours as needed for up to 10 doses. 10/30/18   Darci CurrentBrown, [place] N, MD    Allergies Sulfa antibiotics  No family history on file.  Social History Social History   Tobacco Use  . Smoking status: Current Every Day Smoker    Packs/day: 0.25    Types: Cigarettes  . Smokeless tobacco: Never Used  Substance Use Topics  . Alcohol use: Yes  . Drug use: No    Review of Systems  Constitutional: No fever/chills Eyes: No visual changes. ENT: No sore throat. Respiratory: Denies cough Genitourinary: Negative for dysuria. Musculoskeletal: Negative for back pain. Skin: Negative for rash.  Recheck a finger infection    ____________________________________________   PHYSICAL EXAM:  VITAL SIGNS: ED Triage Vitals  Enc Vitals Group     BP 11/07/18 1433 125/81     Pulse Rate 11/07/18 1433 76     Resp 11/07/18 1433 18     Temp 11/07/18 1433 98.1 F (36.7 C)     Temp Source 11/07/18 1433 Oral     SpO2 11/07/18 1433 98 %     Weight 11/07/18 1434 210 lb (95.3 kg)     Height 11/07/18 1434 5\' 11"  (1.803 m)     Head  Circumference --      Peak Flow --      Pain Score 11/07/18 1434 3     Pain Loc --      Pain Edu? --      Excl. in GC? --     Constitutional: Alert and oriented. Well appearing and in no acute distress. Eyes: Conjunctivae are normal.  Head: Atraumatic. Nose: No congestion/rhinnorhea. Mouth/Throat: Mucous membranes are moist.   Neck:  supple no lymphadenopathy noted Cardiovascular: Normal rate, regular rhythm.  Respiratory: Normal respiratory effort.  No retractions, GU: deferred Musculoskeletal: FROM all extremities, warm and well perfused, full range of motion of the left middle finger.  The dead skin around the previous infection has started to peel.  No infection or drainage is noted at this time. Neurologic:  Normal speech and language.  Skin:  Skin is warm, dry and intact. No rash noted. Psychiatric: Mood and affect are normal. Speech and behavior are normal.  ____________________________________________   LABS (all labs ordered are listed, but only abnormal results are displayed)  Labs Reviewed - No data to display ____________________________________________   ____________________________________________  RADIOLOGY    ____________________________________________   PROCEDURES  Procedure(s) performed: No  Procedures    ____________________________________________   INITIAL IMPRESSION / ASSESSMENT AND PLAN / ED COURSE  Pertinent labs & imaging results that were available during my care of the patient were  reviewed by me and considered in my medical decision making (see chart for details).   Patient is 28 year old male presents emergency department for wound recheck.  Physical exam shows that the infection has resolved.  The skin that was above the infection has peeled back showing new skin below.  No pus, drainage, or fluctuance is noted at the site.  Explained the findings to the patient.  Explained to him he can slightly trim back the skin as it is  peeling as this is dead skin.  Explained to him this is normal due to the type of infection he had.  The skin below is healthy and intact.  Therefore I do not feel like he needs continued care by [place].  He is to apply Vaseline to the area.  He states he understands will comply.  He is to return if worsening.     As part of my medical decision making, I reviewed the following data within the electronic MEDICAL RECORD NUMBER Nursing notes reviewed and incorporated, Old chart reviewed, Notes from prior ED visits and  Controlled Substance Database  ____________________________________________   FINAL CLINICAL IMPRESSION(S) / ED DIAGNOSES  Final diagnoses:  Cellulitis of finger of right hand      NEW MEDICATIONS STARTED DURING THIS VISIT:  New Prescriptions   No medications on file     Note:  This document was prepared using Dragon voice recognition software and may include unintentional dictation errors.    Faythe Ghee, PA-C 11/07/18 1522    Rockne Menghini, MD 11/08/18 Georgiann Mohs

## 2019-07-05 ENCOUNTER — Emergency Department
Admission: EM | Admit: 2019-07-05 | Discharge: 2019-07-07 | Disposition: E | Payer: Medicaid Other | Attending: Emergency Medicine | Admitting: Emergency Medicine

## 2019-07-05 DIAGNOSIS — Y929 Unspecified place or not applicable: Secondary | ICD-10-CM | POA: Diagnosis not present

## 2019-07-05 DIAGNOSIS — Y999 Unspecified external cause status: Secondary | ICD-10-CM | POA: Insufficient documentation

## 2019-07-05 DIAGNOSIS — Y939 Activity, unspecified: Secondary | ICD-10-CM | POA: Insufficient documentation

## 2019-07-05 DIAGNOSIS — X58XXXA Exposure to other specified factors, initial encounter: Secondary | ICD-10-CM | POA: Insufficient documentation

## 2019-07-05 DIAGNOSIS — S29001A Unspecified injury of muscle and tendon of front wall of thorax, initial encounter: Secondary | ICD-10-CM | POA: Diagnosis present

## 2019-07-05 DIAGNOSIS — I469 Cardiac arrest, cause unspecified: Secondary | ICD-10-CM | POA: Insufficient documentation
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
## 2019-07-07 NOTE — ED Notes (Signed)
ME being paged by secretary now.

## 2019-07-07 NOTE — ED Notes (Signed)
Pt leaving for [facility] per ME/EDP Archie Balboa now.
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
## 2019-07-07 NOTE — ED Provider Notes (Signed)
   PROCEDURES    CHEST TUBE INSERTION  Date/Time: 07/06/2019 12:55 AM Performed by: Vanessa [place], MD Authorized by: Vanessa [place], MD   Consent:    Consent obtained:  Emergent situation Procedure details:    Tube size (Fr):  24   Dissection instrument:  Finger and Kelly clamp   Tube connected to:  Suction   Drainage characteristics:  Bloody Comments:     No gush of air on the right. Did have blood return. No return of rosc so proceeded with evaluation on the left side.  CHEST TUBE INSERTION  Date/Time: 07/06/2019 12:59 AM Performed by: Vanessa [place], MD Authorized by: Vanessa [place], MD   Consent:    Consent obtained:  Emergent situation Procedure details:    Placement location:  L anterior   Scalpel size:  11   Dissection instrument:  Claiborne Billings clamp Comments:     Initially did left sided finger thoracostomy with out evidence of PTX.  Therefore decision was made to proceed with open thoracotomy given penetrating trauma with witnessed arrest.  Pericardium was opened without evidence of pericardial effusion and no injuries noted to the heart.          Vanessa [place], MD 07/06/19 [phone]
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
## 2019-07-07 NOTE — ED Notes (Signed)
Talked with Rocky Crafts at [facility]. Will call back with next of kin name and phone number.

## 2019-07-07 DEATH — deceased

## 2019-07-09 LAB — TYPE AND SCREEN
Unit division: 0
Unit division: 0

## 2019-07-09 LAB — BPAM RBC
Blood Product Expiration Date: 202010232359
Blood Product Expiration Date: 202010232359
ISSUE DATE / TIME: 202009291758
ISSUE DATE / TIME: 202009291758
Unit Type and Rh: 5100
Unit Type and Rh: 5100

## 2019-07-09 LAB — PREPARE RBC (CROSSMATCH)
